# Patient Record
Sex: Male | Born: 1985 | Race: White | Hispanic: No | Smoking: Former smoker
Health system: Southern US, Community
[De-identification: ages and names within clinical notes are randomized; demographics above are authoritative.]

## PROBLEM LIST (undated history)

## (undated) DIAGNOSIS — F419 Anxiety disorder, unspecified: Secondary | ICD-10-CM

## (undated) DIAGNOSIS — F909 Attention-deficit hyperactivity disorder, unspecified type: Secondary | ICD-10-CM

## (undated) HISTORY — PX: CLOSED REDUCTION HAND FRACTURE: SHX973

## (undated) HISTORY — PX: MYRINGOTOMY WITH TUBE PLACEMENT: SHX5663

## (undated) HISTORY — DX: Anxiety disorder, unspecified: F41.9

## (undated) HISTORY — DX: Attention-deficit hyperactivity disorder, unspecified type: F90.9

---
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
## 2008-09-04 ENCOUNTER — Emergency Department (HOSPITAL_COMMUNITY): Admission: EM | Admit: 2008-09-04 | Discharge: 2008-09-04 | Payer: Self-pay | Admitting: Emergency Medicine
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
## 2017-05-09 ENCOUNTER — Encounter: Payer: Self-pay | Admitting: Physician Assistant

## 2017-05-09 ENCOUNTER — Ambulatory Visit (INDEPENDENT_AMBULATORY_CARE_PROVIDER_SITE_OTHER): Payer: PRIVATE HEALTH INSURANCE | Admitting: Physician Assistant

## 2017-05-09 VITALS — BP 116/72 | HR 68 | Temp 98.5°F | Resp 16 | Ht 68.0 in | Wt 173.0 lb

## 2017-05-09 DIAGNOSIS — Z131 Encounter for screening for diabetes mellitus: Secondary | ICD-10-CM

## 2017-05-09 DIAGNOSIS — F329 Major depressive disorder, single episode, unspecified: Secondary | ICD-10-CM | POA: Diagnosis not present

## 2017-05-09 DIAGNOSIS — Z13220 Encounter for screening for lipoid disorders: Secondary | ICD-10-CM | POA: Diagnosis not present

## 2017-05-09 DIAGNOSIS — Z0000 Encounter for general adult medical examination without abnormal findings: Secondary | ICD-10-CM | POA: Diagnosis not present

## 2017-05-09 DIAGNOSIS — Z1329 Encounter for screening for other suspected endocrine disorder: Secondary | ICD-10-CM | POA: Diagnosis not present

## 2017-05-09 DIAGNOSIS — Z8659 Personal history of other mental and behavioral disorders: Secondary | ICD-10-CM

## 2017-05-09 DIAGNOSIS — Z23 Encounter for immunization: Secondary | ICD-10-CM | POA: Diagnosis not present
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
## 2017-05-09 NOTE — Patient Instructions (Signed)

## 2017-05-12 ENCOUNTER — Encounter: Payer: Self-pay | Admitting: Physician Assistant

## 2017-05-12 DIAGNOSIS — Z8659 Personal history of other mental and behavioral disorders: Secondary | ICD-10-CM | POA: Insufficient documentation

## 2017-05-12 DIAGNOSIS — F329 Major depressive disorder, single episode, unspecified: Secondary | ICD-10-CM | POA: Insufficient documentation

## 2017-05-13 ENCOUNTER — Telehealth: Payer: Self-pay | Admitting: Physician Assistant

## 2017-05-13 NOTE — Telephone Encounter (Signed)
Can we please call patient and let him know that we do not have paper or electronic records of his assessment or treatment for ADHD and to please go ahead with psychology referral for assessment. If appropriate, will continue treatment after that point. Thank you.

## 2017-06-13 ENCOUNTER — Ambulatory Visit: Payer: PRIVATE HEALTH INSURANCE | Admitting: Physician Assistant

## 2020-04-12 ENCOUNTER — Encounter: Payer: Self-pay | Admitting: Family Medicine

## 2020-04-14 ENCOUNTER — Encounter: Payer: Self-pay | Admitting: Family Medicine

## 2022-08-26 ENCOUNTER — Ambulatory Visit (INDEPENDENT_AMBULATORY_CARE_PROVIDER_SITE_OTHER): Payer: Commercial Managed Care - PPO | Admitting: Family Medicine

## 2022-08-26 ENCOUNTER — Encounter: Payer: Self-pay | Admitting: Family Medicine

## 2022-08-26 VITALS — BP 122/89 | HR 105 | Temp 98.8°F | Ht 67.0 in | Wt 185.0 lb

## 2022-08-26 DIAGNOSIS — L409 Psoriasis, unspecified: Secondary | ICD-10-CM | POA: Diagnosis not present

## 2022-08-26 DIAGNOSIS — Z1159 Encounter for screening for other viral diseases: Secondary | ICD-10-CM | POA: Diagnosis not present

## 2022-08-26 DIAGNOSIS — F909 Attention-deficit hyperactivity disorder, unspecified type: Secondary | ICD-10-CM

## 2022-08-26 DIAGNOSIS — Z0001 Encounter for general adult medical examination with abnormal findings: Secondary | ICD-10-CM | POA: Diagnosis not present

## 2022-08-26 DIAGNOSIS — Z13220 Encounter for screening for lipoid disorders: Secondary | ICD-10-CM | POA: Diagnosis not present

## 2022-08-26 DIAGNOSIS — Z0000 Encounter for general adult medical examination without abnormal findings: Secondary | ICD-10-CM

## 2022-08-26 LAB — CBC WITH DIFFERENTIAL/PLATELET
Basophils Absolute: 38 cells/uL (ref 0–200)
Eosinophils Absolute: 221 cells/uL (ref 15–500)
Eosinophils Relative: 3.5 %
Hemoglobin: 16.1 g/dL (ref 13.2–17.1)
Lymphs Abs: 1644 cells/uL (ref 850–3900)
MCH: 31.6 pg (ref 27.0–33.0)
MCHC: 34.7 g/dL (ref 32.0–36.0)
Monocytes Relative: 10.2 %
RBC: 5.09 10*6/uL (ref 4.20–5.80)
Total Lymphocyte: 26.1 %
WBC: 6.3 10*3/uL (ref 3.8–10.8)

## 2022-08-26 MED ORDER — AMPHETAMINE-DEXTROAMPHETAMINE 10 MG PO TABS: 10.0000 mg | ORAL_TABLET | Freq: Two times a day (BID) | ORAL | 0 refills | Status: DC

## 2022-08-26 MED ORDER — HYDROCORTISONE BUTYRATE 0.1 % EX CREA: 1.0000 | TOPICAL_CREAM | Freq: Two times a day (BID) | CUTANEOUS | 0 refills | Status: AC

## 2022-08-26 NOTE — Assessment & Plan Note (Signed)

## 2022-08-26 NOTE — Patient Instructions (Signed)
It was great to meet you today and I'm excited to have you join the [facility] practice. I hope you had a positive experience today! If you feel so inclined, please feel free to recommend our practice to friends and family. Loveah Like, FNP-C  

## 2022-08-26 NOTE — Progress Notes (Signed)
New Patient Office Visit  Subjective    Patient ID: Kevin Andersen, male    DOB: June 09, 1986  Age: 37 y.o. MRN: [id number]  CC:  Chief Complaint  Patient presents with   Establish Care    Discuss other health issues as well    HPI [name] presents to establish care. Oriented to practice routines and expectations. He has a PMH of ADHD and overweight. His previous PCP, [facility], shut down and he has not been able to obtain his refills for Adderrall which is affecting his work. He is also seeing a dietician that is prescribing Phentermine 37.5mg  daily. His primary concerns today include dry skin on his bilateral lower legs that has been there for weeks.  [name] ADHD was well controlled on Adderall 20mg  in AM and 10mg  in PM up until his previous PCP practice closed and he has been unable to obtain his refills. He denies anxiety and depression but did score high on his PHQ and GAD stating that these symptoms are a result of stress from work due to inability to focus. Scored high on the ASRS-v1.1 symptom checklist with every symptom but 3 occurring very often.   Outpatient Encounter Medications as of 08/26/2022  Medication Sig   amphetamine-dextroamphetamine (ADDERALL) 10 MG tablet Take 1 tablet (10 mg total) by mouth 2 (two) times daily.   hydrocortisone butyrate (LUCOID) 0.1 % CREA cream Apply 1 Application topically 2 (two) times daily.   phentermine (ADIPEX-P) 37.5 MG tablet Take 37.5 mg by mouth daily before breakfast.   No facility-administered encounter medications on file as of 08/26/2022.    Past Medical History:  Diagnosis Date   ADHD    Anxiety     Past Surgical History:  Procedure Laterality Date   CLOSED REDUCTION HAND FRACTURE Right    MYRINGOTOMY WITH TUBE PLACEMENT      Family History  Problem Relation Age of Onset   Fibromyalgia Mother    Arthritis Mother    Breast cancer Maternal Grandmother    Lymphoma Maternal Grandmother     Congestive Heart Failure Maternal Grandmother    Mitral valve prolapse Maternal Grandmother    Hypertension Maternal Grandfather    Diabetes Maternal Grandfather    Arthritis Maternal Grandfather    Dementia Paternal Grandmother    Hypertension Paternal Grandmother    Congestive Heart Failure Paternal Grandmother    Lung cancer Paternal Grandfather    Kidney cancer Paternal Grandfather     Social History   Socioeconomic History   Marital status: Single    Spouse name: Not on file   Number of children: Not on file   Years of education: Not on file   Highest education level: Not on file  Occupational History   Not on file  Tobacco Use   Smoking status: Former    Types: Cigarettes    Quit date: 07/29/2014    Years since quitting: 8.0   Smokeless tobacco: Never  Vaping Use   Vaping Use: Never used  Substance and Sexual Activity   Alcohol use: No   Drug use: No   Sexual activity: Yes    Partners: Female  Other Topics Concern   Not on file  Social History Narrative   Not on file   Social Determinants of Health   Financial Resource Strain: Not on file  Food Insecurity: Not on file  Transportation Needs: Not on file  Physical Activity: Not on file  Stress: Not on file  Social Connections: Not on file  Intimate Partner Violence: Not on file    Review of Systems  Constitutional: Negative.   HENT: Negative.    Eyes: Negative.   Respiratory: Negative.    Cardiovascular: Negative.   Gastrointestinal: Negative.   Genitourinary: Negative.   Musculoskeletal: Negative.   Skin:  Positive for rash.  Neurological: Negative.   Endo/Heme/Allergies: Negative.   Psychiatric/Behavioral: Negative.    All other systems reviewed and are negative.     08/26/2022    8:56 AM  GAD 7 : Generalized Anxiety Score  Nervous, Anxious, on Edge 3  Control/stop worrying 3  Worry too much - different things 3  Trouble relaxing 3  Restless 3  Easily annoyed or irritable 1  Afraid -  awful might happen 3  Total GAD 7 Score 19  Anxiety Difficulty Very difficult        08/26/2022    8:56 AM 05/09/2017    2:55 PM  Depression screen PHQ 2/9  Decreased Interest 2 3  Down, Depressed, Hopeless 2 3  PHQ - 2 Score 4 6  Altered sleeping 2 3  Tired, decreased energy 1 3  Change in appetite 3 3  Feeling bad or failure about yourself  1 3  Trouble concentrating 3 3  Moving slowly or fidgety/restless 2 2  Suicidal thoughts 0 0  PHQ-9 Score 16 23  Difficult doing work/chores Very difficult Extremely dIfficult        Objective    BP 122/89   Pulse (!) 105   Temp 98.8 F (37.1 C) (Oral)   Ht 5\' 7"  (1.702 m)   Wt 185 lb (83.9 kg)   SpO2 96%   BMI 28.98 kg/m   Physical Exam Vitals and nursing note reviewed.  Constitutional:      Appearance: Normal appearance. He is normal weight.  HENT:     Head: Normocephalic and atraumatic.     Right Ear: Tympanic membrane, ear canal and external ear normal.     Left Ear: Tympanic membrane, ear canal and external ear normal.     Nose: Nose normal.     Mouth/Throat:     Mouth: Mucous membranes are moist.     Pharynx: Oropharynx is clear.  Eyes:     Extraocular Movements: Extraocular movements intact.     Right eye: Normal extraocular motion and no nystagmus.     Left eye: Normal extraocular motion and no nystagmus.     Conjunctiva/sclera: Conjunctivae normal.     Pupils: Pupils are equal, round, and reactive to light.  Cardiovascular:     Rate and Rhythm: Normal rate and regular rhythm.     Pulses: Normal pulses.     Heart sounds: Normal heart sounds.  Pulmonary:     Effort: Pulmonary effort is normal.     Breath sounds: Normal breath sounds.  Abdominal:     General: Bowel sounds are normal.     Palpations: Abdomen is soft.  Genitourinary:    Comments: Deferred using shared decision making Musculoskeletal:        General: Normal range of motion.     Cervical back: Normal range of motion and neck supple.   Skin:    General: Skin is warm and dry.     Capillary Refill: Capillary refill takes less than 2 seconds.     Findings: Rash present. Rash is macular and scaling.       Neurological:     General: No focal deficit present.  Mental Status: He is alert. Mental status is at baseline.  Psychiatric:        Mood and Affect: Mood normal.        Speech: Speech normal.        Behavior: Behavior normal.        Thought Content: Thought content normal.        Cognition and Memory: Cognition and memory normal.        Judgment: Judgment normal.         Assessment & Plan:   Problem List Items Addressed This Visit       Musculoskeletal and Integument   Psoriasis    Patient has several pink, scaly plaques to his bilateral lower extremities between his knees and ankles. No identifiable triggers. Appearance is consistent with psoriasis. Will treat with Hydrocortisone .1% cream BID and will return to office if symptoms persist or worsen.        Other   Attention deficit hyperactivity disorder (ADHD)    Patient has history of ADHD that seems to be exasperated by lapse in medication. He reports this is contributing to his symptoms of depression and anxiety as he is feeling stress from work. Denies SI. Will restart Adderall at 10mg  BID, he would like to start at the lower dose. Instructed to stop Phentermine and counseled on the risks of taking both, he is OK with stopping it. Baseline EKG (NSR HR 79) obtained and PDMP reviewed. Follow-up in 4 weeks.      Relevant Orders   EKG 12-Lead (Completed)   Physical exam, annual - Primary    Today your medical history was reviewed and routine physical exam with labs was performed. Recommend 150 minutes of moderate intensity exercise weekly and consuming a well-balanced diet. Advised to stop smoking if a smoker, avoid smoking if a non-smoker, limit alcohol consumption to 1 drink per day for women and 2 drinks per day for men, and avoid illicit drug use.  Counseled on safe sex practices and offered STI testing today. Counseled on the importance of sunscreen use. Counseled in mental health awareness and when to seek medical care. Vaccine maintenance discussed. Appropriate health maintenance items reviewed. Return to office in 1 year for annual physical exam.       Relevant Orders   CBC with Differential/Platelet   COMPLETE METABOLIC PANEL WITH GFR   Lipid panel   Hepatitis C antibody   EKG 12-Lead (Completed)   Other Visit Diagnoses     Need for hepatitis C screening test       Relevant Orders   Hepatitis C antibody   Screening for lipid disorders       Relevant Orders   Lipid panel       Return in about 4 weeks (around 09/23/2022) for ADHD.   Rubie Maid, FNP

## 2022-08-26 NOTE — Assessment & Plan Note (Addendum)
Patient has history of ADHD that seems to be exasperated by lapse in medication. He reports this is contributing to his symptoms of depression and anxiety as he is feeling stress from work. Denies SI. Will restart Adderall at 10mg  BID, he would like to start at the lower dose. Instructed to stop Phentermine and counseled on the risks of taking both, he is OK with stopping it. Baseline EKG (NSR HR 79) obtained and PDMP reviewed. Follow-up in 4 weeks.

## 2022-08-26 NOTE — Assessment & Plan Note (Signed)
Patient has several pink, scaly plaques to his bilateral lower extremities between his knees and ankles. No identifiable triggers. Appearance is consistent with psoriasis. Will treat with Hydrocortisone .1% cream BID and will return to office if symptoms persist or worsen.

## 2022-08-27 ENCOUNTER — Other Ambulatory Visit: Payer: Self-pay | Admitting: Family Medicine

## 2022-08-27 LAB — CBC WITH DIFFERENTIAL/PLATELET
Absolute Monocytes: 643 cells/uL (ref 200–950)
Basophils Relative: 0.6 %
HCT: 46.4 % (ref 38.5–50.0)
MCV: 91.2 fL (ref 80.0–100.0)
MPV: 9.9 fL (ref 7.5–12.5)
Neutro Abs: 3755 cells/uL (ref 1500–7800)
Neutrophils Relative %: 59.6 %
Platelets: 346 10*3/uL (ref 140–400)
RDW: 13 % (ref 11.0–15.0)

## 2022-08-27 LAB — COMPLETE METABOLIC PANEL WITH GFR
AG Ratio: 2.4 (calc) (ref 1.0–2.5)
ALT: 33 U/L (ref 9–46)
AST: 24 U/L (ref 10–40)
Albumin: 5.2 g/dL — ABNORMAL HIGH (ref 3.6–5.1)
Alkaline phosphatase (APISO): 65 U/L (ref 36–130)
BUN: 22 mg/dL (ref 7–25)
CO2: 28 mmol/L (ref 20–32)
Calcium: 10.4 mg/dL — ABNORMAL HIGH (ref 8.6–10.3)
Chloride: 103 mmol/L (ref 98–110)
Creat: 1.15 mg/dL (ref 0.60–1.26)
Globulin: 2.2 g/dL (calc) (ref 1.9–3.7)
Glucose, Bld: 99 mg/dL (ref 65–99)
Potassium: 5.3 mmol/L (ref 3.5–5.3)
Sodium: 142 mmol/L (ref 135–146)
Total Bilirubin: 0.6 mg/dL (ref 0.2–1.2)
Total Protein: 7.4 g/dL (ref 6.1–8.1)
eGFR: 84 mL/min/{1.73_m2} (ref >=60)

## 2022-08-27 LAB — LIPID PANEL
Cholesterol: 200 mg/dL — ABNORMAL HIGH (ref <200)
HDL: 61 mg/dL (ref >=40)
LDL Cholesterol (Calc): 116 mg/dL (calc) — ABNORMAL HIGH
Non-HDL Cholesterol (Calc): 139 mg/dL (calc) — ABNORMAL HIGH (ref <130)
Total CHOL/HDL Ratio: 3.3 (calc) (ref <5.0)
Triglycerides: 115 mg/dL (ref <150)

## 2022-08-27 LAB — HEPATITIS C ANTIBODY: Hepatitis C Ab: NONREACTIVE

## 2022-10-03 ENCOUNTER — Ambulatory Visit (INDEPENDENT_AMBULATORY_CARE_PROVIDER_SITE_OTHER): Payer: Commercial Managed Care - PPO | Admitting: Family Medicine

## 2022-10-03 VITALS — BP 100/72 | HR 63 | Temp 97.5°F | Ht 67.0 in | Wt 174.0 lb

## 2022-10-03 DIAGNOSIS — L409 Psoriasis, unspecified: Secondary | ICD-10-CM

## 2022-10-03 DIAGNOSIS — F411 Generalized anxiety disorder: Secondary | ICD-10-CM | POA: Diagnosis not present

## 2022-10-03 DIAGNOSIS — F902 Attention-deficit hyperactivity disorder, combined type: Secondary | ICD-10-CM

## 2022-10-03 NOTE — Progress Notes (Signed)
Acute Office Visit  Subjective:     Patient ID: Kevin Andersen, male    DOB: [birth year], 37 y.o.   MRN: [id number]  Chief Complaint  Patient presents with   Follow-up    med management/med refills - JBG\\\    HPI Patient is in today for ADHD follow-up. He also requests a dermatology referral as his psoriasis is not improved with steroid cream. We discussed his uncontrolled anxiety and he is also exploring trying medication management again. He has tried several drugs in the past without effect but feels like his anxiety is uncontrolled. We discussed trying GeneSight and he is open to this to narrow down best medication for him.  ADHD FOLLOW UP ADHD status: better Satisfied with current therapy: yes Medication compliance:  excellent compliance Controlled substance contract: no Previous psychiatry evaluation: yes Previous medications: yes adderall   Taking meds on weekends/vacations: occasionally Work/school performance:  excellent Difficulty sustaining attention/completing tasks: no Distracted by extraneous stimuli: no Does not listen when spoken to: no  Fidgets with hands or feet: no Unable to stay in seat: no Blurts out/interrupts others: yes ADHD Medication Side Effects: no    Decreased appetite: no    Headache: no    Sleeping disturbance pattern: no    Irritability: no    Rebound effects (worse than baseline) off medication: no    Anxiousness: no    Dizziness: no    Tics: no   Review of Systems  All other systems reviewed and are negative.       Objective:    BP 100/72   Pulse 63   Temp (!) 97.5 F (36.4 C) (Oral)   Ht '5\' 7"'$  (1.702 m)   Wt 174 lb (78.9 kg)   SpO2 97%   BMI 27.25 kg/m    Physical Exam Vitals and nursing note reviewed.  Constitutional:      Appearance: Normal appearance. He is normal weight.  HENT:     Head: Normocephalic and atraumatic.  Cardiovascular:     Rate and Rhythm: Normal rate and regular rhythm.     Pulses: Normal  pulses.     Heart sounds: Normal heart sounds.  Pulmonary:     Effort: Pulmonary effort is normal.     Breath sounds: Normal breath sounds.  Skin:    General: Skin is warm and dry.     Capillary Refill: Capillary refill takes less than 2 seconds.  Neurological:     General: No focal deficit present.     Mental Status: He is alert and oriented to person, place, and time. Mental status is at baseline.  Psychiatric:        Mood and Affect: Mood normal.        Behavior: Behavior normal.        Thought Content: Thought content normal.        Judgment: Judgment normal.     No results found for any visits on 10/03/22.      Assessment & Plan:   Problem List Items Addressed This Visit       Musculoskeletal and Integument   Psoriasis - Primary   Relevant Orders   Ambulatory referral to Dermatology     Other   Attention deficit hyperactivity disorder (ADHD)    Improving. Continue Adderall '10mg'$  BID. He is no longer taking phentermine. No side effects. Follow up in 3 months.      Generalized anxiety disorder    Open to trying a medication. Would like to  start with GeneSight as he has tried many medications in the past. Encouraged mindful meditation and caffeine reduction, good sleep hygiene. He declines therapy or referral at this time due to time constraints. GAD 19. Denies SI.        No orders of the defined types were placed in this encounter.   Return in about 3 months (around 01/03/2023) for ADHD.  Rubie Maid, FNP

## 2022-10-03 NOTE — Patient Instructions (Addendum)
Depression Medication Choice Decision Aid ([email or web address])

## 2022-10-03 NOTE — Assessment & Plan Note (Signed)
Improving. Continue Adderall '10mg'$  BID. He is no longer taking phentermine. No side effects. Follow up in 3 months.

## 2022-10-03 NOTE — Assessment & Plan Note (Addendum)
Open to trying a medication. Would like to start with GeneSight as he has tried many medications in the past. Encouraged mindful meditation and caffeine reduction, good sleep hygiene. He declines therapy or referral at this time due to time constraints. GAD 19. Denies SI.

## 2022-10-07 ENCOUNTER — Other Ambulatory Visit: Payer: Self-pay

## 2022-10-07 NOTE — Telephone Encounter (Signed)
Pt need by tomorrow pt going out of town.

## 2022-10-08 MED ORDER — AMPHETAMINE-DEXTROAMPHETAMINE 10 MG PO TABS: 10.0000 mg | ORAL_TABLET | Freq: Two times a day (BID) | ORAL | 0 refills | Status: DC

## 2022-10-15 ENCOUNTER — Encounter: Payer: Self-pay | Admitting: Family Medicine

## 2022-11-25 ENCOUNTER — Other Ambulatory Visit: Payer: Self-pay | Admitting: Family Medicine

## 2022-11-25 MED ORDER — AMPHETAMINE-DEXTROAMPHETAMINE 10 MG PO TABS: 10.0000 mg | ORAL_TABLET | Freq: Two times a day (BID) | ORAL | 0 refills | Status: DC

## 2022-12-03 ENCOUNTER — Other Ambulatory Visit: Payer: Self-pay | Admitting: Family Medicine

## 2022-12-09 ENCOUNTER — Telehealth: Payer: Self-pay

## 2022-12-09 NOTE — Telephone Encounter (Signed)
Pt called in to check the status of this refill for amphetamine-dextroamphetamine (ADDERALL) 10 MG tablet [[id number]]. Please advise.  Cb#: [phone]  LOV: 10/03/22  PHARMACY: [facility] [address], [place] - [address] [place] [address] Phone: [phone]  Fax: [phone]

## 2023-03-25 ENCOUNTER — Ambulatory Visit (INDEPENDENT_AMBULATORY_CARE_PROVIDER_SITE_OTHER): Payer: Commercial Managed Care - PPO | Admitting: Family Medicine

## 2023-03-25 VITALS — BP 130/74 | HR 68 | Temp 97.8°F | Ht 67.0 in | Wt 179.0 lb

## 2023-03-25 DIAGNOSIS — Z79899 Other long term (current) drug therapy: Secondary | ICD-10-CM

## 2023-03-25 DIAGNOSIS — G4700 Insomnia, unspecified: Secondary | ICD-10-CM | POA: Diagnosis not present

## 2023-03-25 DIAGNOSIS — F902 Attention-deficit hyperactivity disorder, combined type: Secondary | ICD-10-CM

## 2023-03-25 DIAGNOSIS — E663 Overweight: Secondary | ICD-10-CM | POA: Insufficient documentation

## 2023-03-25 MED ORDER — AMPHETAMINE-DEXTROAMPHETAMINE 10 MG PO TABS: ORAL_TABLET | ORAL | 0 refills | Status: DC

## 2023-03-25 MED ORDER — TRAZODONE HCL 50 MG PO TABS: 25.0000 mg | ORAL_TABLET | Freq: Every evening | ORAL | 0 refills | Status: DC | PRN

## 2023-03-25 NOTE — Assessment & Plan Note (Signed)
Sleep onset difficulties. Provided with handout on good sleep hygiene practices. He is exercising and eating healthy, these have helped a little. He is tracking sleep using his watch and gets 6 hours on average. Will try Trazodone 25-50mg  nightly PRN and see if there is improvement. Advised to hold afternoon Adderall when able.

## 2023-03-25 NOTE — Assessment & Plan Note (Addendum)
Chronic, room for improvement. Increase Adderall to 20mg  daily in AM and 10mg  as needed in afternoon. No side effects. PDMP reviewed, UDS and controlled substance contract completed. Follow up in 3 months or sooner if needed.

## 2023-03-25 NOTE — Progress Notes (Signed)
Subjective:  HPI: Kevin Andersen is a 37 y.o. male presenting on 03/25/2023 for Follow-up (Refill my Adderall prescription/)   HPI Patient is in today for ADHD follow-up and medication refills. He reports symptoms are stable but he still feels like there is room to improve during the day and would like to try increase in AM dose. He also endorses difficulty falling asleep and restless nights for many years. This is not worse since restarting Adderall. He has tried Melatonin and Zquil. He reports difficulty falling asleep and mind racing and worrying. He denies snoring, frequent night wakings or startling awake, and spouse denies these as well. He avoids caffeine, does not take adderall after lunchtime, does not exercise before bed, does use blue screens before bed. Has tried Melatonin and zquil in the past.  ADHD FOLLOW UP ADHD status: controlled Satisfied with current therapy:  would like to try Vyvanse if not too expensive, this worked for him in the past Medication compliance:  excellent compliance Controlled substance contract: yes Previous psychiatry evaluation: yes Previous medications: yes adderall   Taking meds on weekends/vacations: occasionally Work/school performance:  excellent Difficulty sustaining attention/completing tasks:  better Distracted by extraneous stimuli: yes Does not listen when spoken to: yes  Fidgets with hands or feet: yes Unable to stay in seat: yes Blurts out/interrupts others: yes ADHD Medication Side Effects: no    Decreased appetite: no    Headache: no    Sleeping disturbance pattern: yes    Irritability: no    Rebound effects (worse than baseline) off medication: no    Anxiousness: yes    Dizziness: no    Tics: no   Review of Systems  All other systems reviewed and are negative.   Relevant past medical history reviewed and updated as indicated.   Past Medical History:  Diagnosis Date   ADHD    Anxiety      Past Surgical History:   Procedure Laterality Date   CLOSED REDUCTION HAND FRACTURE Right    MYRINGOTOMY WITH TUBE PLACEMENT      Allergies and medications reviewed and updated.   Current Outpatient Medications:    traZODone (DESYREL) 50 MG tablet, Take 0.5-1 tablets (25-50 mg total) by mouth at bedtime as needed for sleep., Disp: 30 tablet, Rfl: 0   amphetamine-dextroamphetamine (ADDERALL) 10 MG tablet, Take 2 tablets (20 mg total) by mouth daily with breakfast AND 1 tablet (10 mg total) daily in the afternoon., Disp: 180 tablet, Rfl: 0   hydrocortisone butyrate (LUCOID) 0.1 % CREA cream, Apply 1 Application topically 2 (two) times daily. (Patient not taking: Reported on 03/25/2023), Disp: 45 g, Rfl: 0  No Known Allergies  Objective:   BP 130/74   Pulse 68   Temp 97.8 F (36.6 C) (Oral)   Ht 5\' 7"  (1.702 m)   Wt 179 lb (81.2 kg)   SpO2 97%   BMI 28.04 kg/m      03/25/2023    7:58 AM 10/03/2022    3:02 PM 08/26/2022    8:38 AM  Vitals with BMI  Height 5\' 7"  5\' 7"    Weight 179 lbs 174 lbs   BMI 28.03 27.25   Systolic 130 100 433  Diastolic 74 72 89  Pulse 68 63      Physical Exam Vitals and nursing note reviewed.  Constitutional:      Appearance: Normal appearance. He is normal weight.  HENT:     Head: Normocephalic and atraumatic.  Cardiovascular:  Rate and Rhythm: Normal rate and regular rhythm.     Pulses: Normal pulses.     Heart sounds: Normal heart sounds.  Pulmonary:     Effort: Pulmonary effort is normal.     Breath sounds: Normal breath sounds.  Skin:    General: Skin is warm and dry.     Capillary Refill: Capillary refill takes less than 2 seconds.  Neurological:     General: No focal deficit present.     Mental Status: He is alert and oriented to person, place, and time. Mental status is at baseline.  Psychiatric:        Mood and Affect: Mood normal.        Behavior: Behavior normal.        Thought Content: Thought content normal.        Judgment: Judgment normal.      Assessment & Plan:  Attention deficit hyperactivity disorder (ADHD), combined type Assessment & Plan: Chronic, room for improvement. Increase Adderall to 20mg  daily in AM and 10mg  as needed in afternoon. No side effects. PDMP reviewed, UDS and controlled substance contract completed. Follow up in 3 months or sooner if needed.  Orders: -     DRUG MONITOR, PANEL 1, W/CONF, URINE  Insomnia, unspecified type Assessment & Plan: Sleep onset difficulties. Provided with handout on good sleep hygiene practices. He is exercising and eating healthy, these have helped a little. He is tracking sleep using his watch and gets 6 hours on average. Will try Trazodone 25-50mg  nightly PRN and see if there is improvement. Advised to hold afternoon Adderall when able.   Controlled substance agreement signed -     DRUG MONITOR, PANEL 1, W/CONF, URINE  Other orders -     traZODone HCl; Take 0.5-1 tablets (25-50 mg total) by mouth at bedtime as needed for sleep.  Dispense: 30 tablet; Refill: 0 -     Amphetamine-Dextroamphetamine; Take 2 tablets (20 mg total) by mouth daily with breakfast AND 1 tablet (10 mg total) daily in the afternoon.  Dispense: 180 tablet; Refill: 0     Follow up plan: Return in about 3 months (around 06/25/2023) for ADHD and physical in January.  Park Meo, FNP

## 2023-03-27 LAB — DRUG MONITOR, PANEL 1, W/CONF, URINE
Amphetamine: 1460 ng/mL — ABNORMAL HIGH (ref <250)
Amphetamines: POSITIVE ng/mL — AB (ref <500)
Barbiturates: NEGATIVE ng/mL (ref <300)
Benzodiazepines: NEGATIVE ng/mL (ref <100)
Cocaine Metabolite: NEGATIVE ng/mL (ref <150)
Creatinine: 33 mg/dL (ref >=20.0)
Marijuana Metabolite: NEGATIVE ng/mL (ref <20)
Methadone Metabolite: NEGATIVE ng/mL (ref <100)
Methamphetamine: NEGATIVE ng/mL (ref <250)
Opiates: NEGATIVE ng/mL (ref <100)
Oxidant: NEGATIVE ug/mL (ref <200)
Oxycodone: NEGATIVE ng/mL (ref <100)
Phencyclidine: NEGATIVE ng/mL (ref <25)
pH: 6 (ref 4.5–9.0)

## 2023-03-27 LAB — DM TEMPLATE

## 2023-06-30 ENCOUNTER — Ambulatory Visit (INDEPENDENT_AMBULATORY_CARE_PROVIDER_SITE_OTHER): Payer: Commercial Managed Care - PPO | Admitting: Family Medicine

## 2023-06-30 ENCOUNTER — Encounter: Payer: Self-pay | Admitting: Family Medicine

## 2023-06-30 VITALS — BP 115/78 | HR 83 | Temp 98.0°F | Ht 67.0 in | Wt 179.0 lb

## 2023-06-30 DIAGNOSIS — F902 Attention-deficit hyperactivity disorder, combined type: Secondary | ICD-10-CM

## 2023-06-30 DIAGNOSIS — F411 Generalized anxiety disorder: Secondary | ICD-10-CM | POA: Diagnosis not present

## 2023-06-30 MED ORDER — FLUOXETINE HCL 10 MG PO CAPS: 10.0000 mg | ORAL_CAPSULE | Freq: Every day | ORAL | 0 refills | Status: DC

## 2023-06-30 MED ORDER — AMPHETAMINE-DEXTROAMPHETAMINE 20 MG PO TABS: ORAL_TABLET | ORAL | 0 refills | Status: DC

## 2023-06-30 NOTE — Assessment & Plan Note (Signed)
GAD 21. He is open to trying a medication. Will start Prozac 10mg  daily. He declines psychiatry at this time due to time constraints. Denies SI.

## 2023-06-30 NOTE — Progress Notes (Signed)
Subjective:  HPI: Kevin Andersen is a 37 y.o. male presenting on 06/30/2023 for Follow-up (3 month f/u meds)   HPI Patient is in today for ADHD follow-up. He reports his symptoms are stable on 20mg  Adderall daily, he is not using this most nights and is taking Trazodone PRN for sleep. He continues to endorse uncontrolled anxiety and worry. GeneSight has been performed in past and he is willing to try a medication to help.   ADHD FOLLOW UP ADHD status: stable Satisfied with current therapy: yes Medication compliance:  excellent compliance Controlled substance contract: yes Previous psychiatry evaluation: yes Previous medications: yes adderall and vyvanse (lisdexamfethamine)   Taking meds on weekends/vacations: yes Work/school performance:  good Difficulty sustaining attention/completing tasks: yes Distracted by extraneous stimuli: yes Does not listen when spoken to: no  Fidgets with hands or feet: yes Unable to stay in seat: no Blurts out/interrupts others: no ADHD Medication Side Effects: no    Decreased appetite: no    Headache: no    Sleeping disturbance pattern: yes    Irritability: no    Rebound effects (worse than baseline) off medication: no    Anxiousness: yes    Dizziness: no    Tics: no     06/30/2023    8:13 AM 03/25/2023    8:52 AM 10/03/2022    3:09 PM 08/26/2022    8:56 AM  GAD 7 : Generalized Anxiety Score  Nervous, Anxious, on Edge 3 3 3 3   Control/stop worrying 3 3 3 3   Worry too much - different things 3 3 3 3   Trouble relaxing 3 3 3 3   Restless 3 3 2 3   Easily annoyed or irritable 3 2 2 1   Afraid - awful might happen 3 3 3 3   Total GAD 7 Score 21 20 19 19   Anxiety Difficulty Very difficult  Somewhat difficult Very difficult      Review of Systems  All other systems reviewed and are negative.   Relevant past medical history reviewed and updated as indicated.   Past Medical History:  Diagnosis Date   ADHD    Anxiety      Past Surgical  History:  Procedure Laterality Date   CLOSED REDUCTION HAND FRACTURE Right    MYRINGOTOMY WITH TUBE PLACEMENT      Allergies and medications reviewed and updated.   Current Outpatient Medications:    FLUoxetine (PROZAC) 10 MG capsule, Take 1 capsule (10 mg total) by mouth daily., Disp: 90 capsule, Rfl: 0   traZODone (DESYREL) 50 MG tablet, Take 0.5-1 tablets (25-50 mg total) by mouth at bedtime as needed for sleep., Disp: 30 tablet, Rfl: 0   amphetamine-dextroamphetamine (ADDERALL) 20 MG tablet, Take 1 tablet (20 mg total) by mouth daily with breakfast AND 0.5 tablets (10 mg total) daily in the afternoon., Disp: 45 tablet, Rfl: 0   hydrocortisone butyrate (LUCOID) 0.1 % CREA cream, Apply 1 Application topically 2 (two) times daily. (Patient not taking: Reported on 03/25/2023), Disp: 45 g, Rfl: 0  No Known Allergies  Objective:   BP 115/78   Pulse 83   Temp 98 F (36.7 C) (Oral)   Ht 5\' 7"  (1.702 m)   Wt 179 lb (81.2 kg)   SpO2 98%   BMI 28.04 kg/m      06/30/2023    8:02 AM 03/25/2023    7:58 AM 10/03/2022    3:02 PM  Vitals with BMI  Height 5\' 7"  5\' 7"  5\' 7"   Weight  179 lbs 179 lbs 174 lbs  BMI 28.03 28.03 27.25  Systolic 115 130 478  Diastolic 78 74 72  Pulse 83 68 63     Physical Exam Vitals and nursing note reviewed.  Constitutional:      Appearance: Normal appearance. He is normal weight.  HENT:     Head: Normocephalic and atraumatic.  Skin:    General: Skin is warm and dry.     Capillary Refill: Capillary refill takes less than 2 seconds.  Neurological:     General: No focal deficit present.     Mental Status: He is alert and oriented to person, place, and time. Mental status is at baseline.  Psychiatric:        Mood and Affect: Mood normal.        Behavior: Behavior normal.        Thought Content: Thought content normal.        Judgment: Judgment normal.     Assessment & Plan:  Attention deficit hyperactivity disorder (ADHD), combined type Assessment  & Plan: Chronic stable. Continue Adderall to 20mg  daily in AM and 10mg  as needed in afternoon. Remains anxious, declines psychiatry referral at this time due to cost. PDMP reviewed, UDS and controlled substance contract complete. Follow up in 3 months or sooner if needed.   Generalized anxiety disorder Assessment & Plan: GAD 21. He is open to trying a medication. Will start Prozac 10mg  daily. He declines psychiatry at this time due to time constraints. Denies SI.    Other orders -     FLUoxetine HCl; Take 1 capsule (10 mg total) by mouth daily.  Dispense: 90 capsule; Refill: 0 -     Amphetamine-Dextroamphetamine; Take 1 tablet (20 mg total) by mouth daily with breakfast AND 0.5 tablets (10 mg total) daily in the afternoon.  Dispense: 45 tablet; Refill: 0     Follow up plan: Return in about 6 weeks (around 08/11/2023).  Park Meo, FNP

## 2023-06-30 NOTE — Assessment & Plan Note (Signed)
Chronic stable. Continue Adderall to 20mg  daily in AM and 10mg  as needed in afternoon. Remains anxious, declines psychiatry referral at this time due to cost. PDMP reviewed, UDS and controlled substance contract complete. Follow up in 3 months or sooner if needed.

## 2023-07-15 ENCOUNTER — Other Ambulatory Visit: Payer: Self-pay

## 2023-07-15 ENCOUNTER — Emergency Department
Admission: EM | Admit: 2023-07-15 | Discharge: 2023-07-15 | Disposition: A | Discharge status: Home / Self Care | Payer: Commercial Managed Care - PPO | Attending: Emergency Medicine | Admitting: Emergency Medicine

## 2023-07-15 ENCOUNTER — Emergency Department: Payer: Commercial Managed Care - PPO

## 2023-07-15 DIAGNOSIS — R0789 Other chest pain: Secondary | ICD-10-CM | POA: Diagnosis present

## 2023-07-15 DIAGNOSIS — R079 Chest pain, unspecified: Secondary | ICD-10-CM

## 2023-07-15 DIAGNOSIS — R0602 Shortness of breath: Secondary | ICD-10-CM | POA: Diagnosis not present

## 2023-07-15 LAB — BASIC METABOLIC PANEL
Anion gap: 9 (ref 5–15)
BUN: 19 mg/dL (ref 6–20)
CO2: 25 mmol/L (ref 22–32)
Calcium: 9.2 mg/dL (ref 8.9–10.3)
Chloride: 103 mmol/L (ref 98–111)
Creatinine, Ser: 1 mg/dL (ref 0.61–1.24)
GFR, Estimated: >60 mL/min (ref >60)
Glucose, Bld: 110 mg/dL — ABNORMAL HIGH (ref 70–99)
Potassium: 4.2 mmol/L (ref 3.5–5.1)
Sodium: 137 mmol/L (ref 135–145)

## 2023-07-15 LAB — CBC
HCT: 44.5 % (ref 39.0–52.0)
Hemoglobin: 15.3 g/dL (ref 13.0–17.0)
MCH: 31.9 pg (ref 26.0–34.0)
MCHC: 34.4 g/dL (ref 30.0–36.0)
MCV: 92.7 fL (ref 80.0–100.0)
Platelets: 325 10*3/uL (ref 150–400)
RBC: 4.8 MIL/uL (ref 4.22–5.81)
RDW: 12.4 % (ref 11.5–15.5)
WBC: 6.5 10*3/uL (ref 4.0–10.5)
nRBC: 0 % (ref 0.0–0.2)

## 2023-07-15 LAB — TROPONIN I (HIGH SENSITIVITY): Troponin I (High Sensitivity): <2 ng/L (ref <18)

## 2023-07-15 NOTE — ED Triage Notes (Signed)
Pt comes with cp that started yesterday at lunch. Pt states he has lots of pressure there. Pt states he had weird moment and he got dizzy and slurred some speech. Pt states he drank water and it went away.  Pt states achy pain and sob. Pt denies any N/V/D and no hx of this.   Pt speaking in clear complete sentences.

## 2023-07-15 NOTE — ED Provider Notes (Signed)
[facility] Provider Note    Event Date/Time   First MD Initiated Contact with Patient 07/15/23 1119     (approximate)   History   Chest Pain   HPI  Kevin Andersen is a 37 year old male presenting to the emergency department for evaluation of chest pain.  Yesterday around lunch, patient had onset of chest pain described as a pressure in the center of his chest.  He began to feel nervous that something serious might be going on so he drove home but had an episode of lightheadedness with a less than 10-second episode of difficulty with his speech that spontaneously resolved.  No syncope.  No focal numbness, tingling, weakness.  No recurrent episodes.  Does report some ongoing discomfort in his chest with mild shortness of breath.  No nausea or vomiting, abdominal pain.    Physical Exam   Triage Vital Signs: ED Triage Vitals  Encounter Vitals Group     BP 07/15/23 0850 (!) 145/90     Systolic BP Percentile --      Diastolic BP Percentile --      Pulse Rate 07/15/23 0850 79     Resp 07/15/23 0850 18     Temp 07/15/23 0850 98 F (36.7 C)     Temp src --      SpO2 07/15/23 0850 99 %     Weight 07/15/23 0849 175 lb (79.4 kg)     Height 07/15/23 0849 5\' 7"  (1.702 m)     Head Circumference --      Peak Flow --      Pain Score 07/15/23 0849 3     Pain Loc --      Pain Education --      Exclude from Growth Chart --     Most recent vital signs: Vitals:   07/15/23 0850  BP: (!) 145/90  Pulse: 79  Resp: 18  Temp: 98 F (36.7 C)  SpO2: 99%     General: Awake, interactive  CV:  Regular rate, good peripheral perfusion.  Chest wall: Nontender to palpation  Resp:  Unlabored respirations, lungs clear to auscultation Abd:  Nondistended, nontender Neuro:  Keenly aware, correctly answers month and age, able to blink eyes and squeeze hands, normal extraocular movements, no visual field loss, normal facial symmetry, no arm or leg motor drift, 5-5 strength  in the bilateral upper and lower extremities, no limb ataxia, normal sensation, no aphasia, no dysarthria, no inattention   ED Results / Procedures / Treatments   Labs (all labs ordered are listed, but only abnormal results are displayed) Labs Reviewed  BASIC METABOLIC PANEL - Abnormal; Notable for the following components:      Result Value   Glucose, Bld 110 (*)    All other components within normal limits  CBC  TROPONIN I (HIGH SENSITIVITY)     EKG EKG independently reviewed interpreted by myself (ER attending) demonstrates:  EKG demonstrates normal sinus rhythm at a rate of 75, PR 138, QRS 80, QTc 404, no acute ST changes   RADIOLOGY Imaging independently reviewed and interpreted by myself demonstrates:  CXR without focal consolidation  PROCEDURES:  Critical Care performed: No  Procedures   MEDICATIONS ORDERED IN ED: Medications - No data to display   IMPRESSION / MDM / ASSESSMENT AND PLAN / ED COURSE  I reviewed the triage vital signs and the nursing notes.  Differential diagnosis includes, but is not limited to, ACS, pneumonia, pneumothorax, low risk PE  and PERC negative, stress mediated physiologic response, musculoskeletal pain  Patient's presentation is most consistent with acute presentation with potential threat to life or bodily function.  37 year old male presenting to the emergency department for evaluation of chest pain.  Vital stable on presentation.  Labs reassuring including negative troponin with greater than 3 hours of pain.  Chest x-Natlie Asfour and EKG reassuring.  Low risk heart score.  Regarding brief episode of lightheadedness with difficult speech, suspect likely presyncopal episode.  Consideration for stress mediated physiologic response as well.  Clinical history is not suggestive of TIA, no deficits here suggestive of acute stroke.  I did discuss results of workup with patient.  He has a primary care doctor and is comfortable with discharge home.   Strict return precautions provided.  Patient discharged stable condition.     FINAL CLINICAL IMPRESSION(S) / ED DIAGNOSES   Final diagnoses:  Acute chest pain     Rx / DC Orders   ED Discharge Orders     None        Note:  This document was prepared using Dragon voice recognition software and may include unintentional dictation errors.   Trinna Post, MD 07/15/23 1146

## 2023-07-15 NOTE — Discharge Instructions (Signed)
You were seen in the Emergency Department today for evaluation of your chest pain. Fortunately, your labs, EKG, and chest x-Emmagrace Runkel were overall reassuring against a emergency cause for your pain. Please follow-up with your primary doctor within the next few days for reevaluation.  You can take 600 mg of ibuprofen every 6 hours for the next few days to see if this improves your symptoms.  You can also take Tylenol as needed.  Return to the ER for any new or worsening symptoms including worsening chest pain, difficulty breathing, or any other new or concerning symptoms that you believe warrants immediate attention.

## 2023-07-15 NOTE — ED Notes (Signed)
See triage note  Presents with some chest discomfort since yesterday  No fever or cough

## 2023-08-26 ENCOUNTER — Other Ambulatory Visit: Payer: Self-pay

## 2023-08-26 ENCOUNTER — Other Ambulatory Visit: Payer: Commercial Managed Care - PPO

## 2023-08-26 MED ORDER — AMPHETAMINE-DEXTROAMPHETAMINE 20 MG PO TABS: ORAL_TABLET | ORAL | 0 refills | Status: DC

## 2023-08-26 NOTE — Telephone Encounter (Signed)
Pt came into ask for a courtesy refill of this med amphetamine-dextroamphetamine (ADDERALL) 20 MG tablet [[id number]]. Pt states that he is out of this med. Pt is scheduled for labs and cpe on 09/02/23. Pt would just like to have enough to make it to his appt on 09/02/23. Please advise.  Cb#: [phone]

## 2023-09-02 ENCOUNTER — Ambulatory Visit (INDEPENDENT_AMBULATORY_CARE_PROVIDER_SITE_OTHER): Payer: Commercial Managed Care - PPO | Admitting: Family Medicine

## 2023-09-02 ENCOUNTER — Encounter: Payer: Self-pay | Admitting: Family Medicine

## 2023-09-02 VITALS — BP 120/80 | HR 77 | Temp 98.6°F | Ht 67.0 in | Wt 192.0 lb

## 2023-09-02 DIAGNOSIS — F411 Generalized anxiety disorder: Secondary | ICD-10-CM

## 2023-09-02 DIAGNOSIS — F902 Attention-deficit hyperactivity disorder, combined type: Secondary | ICD-10-CM

## 2023-09-02 DIAGNOSIS — E663 Overweight: Secondary | ICD-10-CM

## 2023-09-02 DIAGNOSIS — Z0001 Encounter for general adult medical examination with abnormal findings: Secondary | ICD-10-CM

## 2023-09-02 DIAGNOSIS — Z0000 Encounter for general adult medical examination without abnormal findings: Secondary | ICD-10-CM

## 2023-09-02 MED ORDER — AMPHETAMINE-DEXTROAMPHETAMINE 20 MG PO TABS: 20.0000 mg | ORAL_TABLET | Freq: Every day | ORAL | 0 refills | Status: DC

## 2023-09-02 NOTE — Assessment & Plan Note (Signed)
 Chronic stable. Continue Adderall to 20mg  daily in AM, he is not needing PM dosing. Remains anxious, declines psychiatry referral at this time due to cost. PDMP reviewed, UDS and controlled substance contract complete. Follow up in 3 months or sooner if needed.

## 2023-09-02 NOTE — Progress Notes (Signed)
 Complete physical exam  Patient: Kevin Andersen   DOB: [birth year]   38 y.o. Male  MRN: [id number]  Subjective:    Chief Complaint  Patient presents with   Follow-up    annual physical     [place] WENZ[place] is a 38 y.o. male who presents today for a complete physical exam. He reports consuming a general diet. The patient does not participate in regular exercise at present. He generally feels well. He reports sleeping poorly. He does not have additional problems to discuss today.   ADHD FOLLOW UP ADHD status: stable Satisfied with current therapy: yes Medication compliance:  excellent compliance Controlled substance contract: yes Previous psychiatry evaluation: yes Previous medications: yes adderall XR and vyvanse (lisdexamfethamine)   Taking meds on weekends/vacations: occasionally Work/school performance:  good Difficulty sustaining attention/completing tasks: yes Distracted by extraneous stimuli: yes Does not listen when spoken to: yes  Fidgets with hands or feet: yes Unable to stay in seat: yes Blurts out/interrupts others: yes ADHD Medication Side Effects: no    Decreased appetite: no    Headache: no    Sleeping disturbance pattern: yes    Irritability: no    Rebound effects (worse than baseline) off medication: no    Anxiousness: yes    Dizziness: no    Tics: no    Most recent fall risk assessment:    06/30/2023    8:12 AM  Fall Risk   Falls in the past year? 0  Number falls in past yr: 0  Injury with Fall? 0  Risk for fall due to : No Fall Risks     Most recent depression screenings:    09/02/2023    8:27 AM 06/30/2023    8:13 AM  PHQ 2/9 Scores  PHQ - 2 Score 4 5  PHQ- 9 Score 20 20    Vision:Not within last year  and Dental: No current dental problems and Receives regular dental care  Patient Active Problem List   Diagnosis Date Noted   Insomnia 03/25/2023   Overweight (BMI 25.0-29.9) 03/25/2023   Controlled substance agreement signed 03/25/2023    Generalized anxiety disorder 10/03/2022   Attention deficit hyperactivity disorder (ADHD) 08/26/2022   Physical exam, annual 08/26/2022   Psoriasis 08/26/2022   Major depressive disorder with current active episode 05/12/2017   History of ADHD 05/12/2017   Past Medical History:  Diagnosis Date   ADHD    Anxiety    Past Surgical History:  Procedure Laterality Date   CLOSED REDUCTION HAND FRACTURE Right    MYRINGOTOMY WITH TUBE PLACEMENT     Social History   Tobacco Use   Smoking status: Former    Current packs/day: 0.00    Types: Cigarettes    Quit date: 07/29/2014    Years since quitting: 9.1   Smokeless tobacco: Never  Vaping Use   Vaping status: Never Used  Substance Use Topics   Alcohol use: No   Drug use: No   Family History  Problem Relation Age of Onset   Fibromyalgia Mother    Arthritis Mother    Breast cancer Maternal Grandmother    Lymphoma Maternal Grandmother    Congestive Heart Failure Maternal Grandmother    Mitral valve prolapse Maternal Grandmother    Hypertension Maternal Grandfather    Diabetes Maternal Grandfather    Arthritis Maternal Grandfather    Dementia Paternal Grandmother    Hypertension Paternal Grandmother    Congestive Heart Failure Paternal Grandmother    Lung cancer  Paternal Grandfather    Kidney cancer Paternal Grandfather    No Known Allergies    Patient Care Team: Kayla Jeoffrey RAMAN, FNP as PCP - General (Family Medicine)   Outpatient Medications Prior to Visit  Medication Sig   traZODone  (DESYREL ) 50 MG tablet Take 0.5-1 tablets (25-50 mg total) by mouth at bedtime as needed for sleep.   [DISCONTINUED] amphetamine -dextroamphetamine  (ADDERALL) 20 MG tablet Take 1 tablet (20 mg total) by mouth daily with breakfast AND 0.5 tablets (10 mg total) daily in the afternoon.   [DISCONTINUED] FLUoxetine  (PROZAC ) 10 MG capsule Take 1 capsule (10 mg total) by mouth daily.   hydrocortisone  butyrate (LUCOID) 0.1 % CREA cream Apply 1  Application topically 2 (two) times daily. (Patient not taking: Reported on 09/02/2023)   No facility-administered medications prior to visit.    Review of Systems  Constitutional: Negative.   HENT: Negative.    Eyes: Negative.   Respiratory: Negative.    Cardiovascular: Negative.   Gastrointestinal: Negative.   Genitourinary: Negative.   Musculoskeletal: Negative.   Skin: Negative.   Neurological: Negative.   Endo/Heme/Allergies: Negative.   Psychiatric/Behavioral:  Negative for suicidal ideas. The patient is nervous/anxious.   All other systems reviewed and are negative.         Objective:     BP 120/80   Pulse 77   Temp 98.6 F (37 C) (Oral)   Ht 5' 7 (1.702 m)   Wt 192 lb (87.1 kg)   SpO2 97%   BMI 30.07 kg/m  BP Readings from Last 3 Encounters:  09/02/23 120/80  07/15/23 (!) 145/90  06/30/23 115/78   Wt Readings from Last 3 Encounters:  09/02/23 192 lb (87.1 kg)  07/15/23 175 lb (79.4 kg)  06/30/23 179 lb (81.2 kg)      Physical Exam Vitals and nursing note reviewed.  Constitutional:      Appearance: Normal appearance. He is overweight.  HENT:     Head: Normocephalic and atraumatic.     Right Ear: Tympanic membrane, ear canal and external ear normal.     Left Ear: Tympanic membrane, ear canal and external ear normal.     Nose: Nose normal.     Mouth/Throat:     Mouth: Mucous membranes are moist.     Pharynx: Oropharynx is clear.  Eyes:     Extraocular Movements: Extraocular movements intact.     Right eye: Normal extraocular motion and no nystagmus.     Left eye: Normal extraocular motion and no nystagmus.     Conjunctiva/sclera: Conjunctivae normal.     Pupils: Pupils are equal, round, and reactive to light.  Cardiovascular:     Rate and Rhythm: Normal rate and regular rhythm.     Pulses: Normal pulses.     Heart sounds: Normal heart sounds.  Pulmonary:     Effort: Pulmonary effort is normal.     Breath sounds: Normal breath sounds.   Abdominal:     General: Bowel sounds are normal.     Palpations: Abdomen is soft.  Genitourinary:    Comments: Deferred using shared decision making Musculoskeletal:        General: Normal range of motion.     Cervical back: Normal range of motion and neck supple.  Skin:    General: Skin is warm and dry.     Capillary Refill: Capillary refill takes less than 2 seconds.  Neurological:     General: No focal deficit present.     Mental Status:  He is alert. Mental status is at baseline.  Psychiatric:        Mood and Affect: Mood normal.        Speech: Speech normal.        Behavior: Behavior normal.        Thought Content: Thought content normal.        Cognition and Memory: Cognition and memory normal.        Judgment: Judgment normal.      No results found for any visits on 09/02/23. Last CBC Lab Results  Component Value Date   WBC 6.5 07/15/2023   HGB 15.3 07/15/2023   HCT 44.5 07/15/2023   MCV 92.7 07/15/2023   MCH 31.9 07/15/2023   RDW 12.4 07/15/2023   PLT 325 07/15/2023   Last metabolic panel Lab Results  Component Value Date   GLUCOSE 110 (H) 07/15/2023   NA 137 07/15/2023   K 4.2 07/15/2023   CL 103 07/15/2023   CO2 25 07/15/2023   BUN 19 07/15/2023   CREATININE 1.00 07/15/2023   GFRNONAA >60 07/15/2023   CALCIUM 9.2 07/15/2023   PROT 7.4 08/26/2022   BILITOT 0.6 08/26/2022   AST 24 08/26/2022   ALT 33 08/26/2022   ANIONGAP 9 07/15/2023   Last lipids Lab Results  Component Value Date   CHOL 200 (H) 08/26/2022   HDL 61 08/26/2022   LDLCALC 116 (H) 08/26/2022   TRIG 115 08/26/2022   CHOLHDL 3.3 08/26/2022        Assessment & Plan:    Routine Health Maintenance and Physical Exam   There is no immunization history on file for this patient.  Health Maintenance  Topic Date Due   INFLUENZA VACCINE  10/27/2023 (Originally 02/27/2023)   COVID-19 Vaccine (1) 07/21/2024 (Originally 07/09/1991)   Hepatitis C Screening  Completed   HPV VACCINES   Aged Out   DTaP/Tdap/Td  Discontinued   HIV Screening  Discontinued    Discussed health benefits of physical activity, and encouraged him to engage in regular exercise appropriate for his age and condition.  Problem List Items Addressed This Visit     Attention deficit hyperactivity disorder (ADHD)   Chronic stable. Continue Adderall to 20mg  daily in AM, he is not needing PM dosing. Remains anxious, declines psychiatry referral at this time due to cost. PDMP reviewed, UDS and controlled substance contract complete. Follow up in 3 months or sooner if needed.      Physical exam, annual - Primary   Today your medical history was reviewed and routine physical exam with labs was performed. Recommend 150 minutes of moderate intensity exercise weekly and consuming a well-balanced diet. Advised to stop smoking if a smoker, avoid smoking if a non-smoker, limit alcohol consumption to 1 drink per day for women and 2 drinks per day for men, and avoid illicit drug use. Counseled on the importance of sunscreen use. Counseled in mental health awareness and when to seek medical care. Vaccine maintenance discussed. Appropriate health maintenance items reviewed. Return to office in 1 year for annual physical exam.       Generalized anxiety disorder   Better with control of his ADHD, failed trial of Prozac  due to side effects chest pain. Would not like to try another medication at this time. Encouraged psychiatry however he is reluctant. Return to office as need for uncontrolled anxiety.       Overweight (BMI 25.0-29.9)   Recommend low calorie, heart healthy diet and moderate intensity exercise 150 minutes  weekly. This is 3-5 times weekly for 30-50 minutes each session. Goal should be pace of 3 miles/hours, or walking 1.5 miles in 30 minutes and include strength training. Encouraged to resume workout routine.       Return in about 3 months (around 11/30/2023) for chronic follow-up.     Jeoffrey GORMAN Barrio,  FNP

## 2023-09-02 NOTE — Assessment & Plan Note (Signed)
 Today your medical history was reviewed and routine physical exam with labs was performed. Recommend 150 minutes of moderate intensity exercise weekly and consuming a well-balanced diet. Advised to stop smoking if a smoker, avoid smoking if a non-smoker, limit alcohol  consumption to 1 drink per day for women and 2 drinks per day for men, and avoid illicit drug use.Counseled on the importance of sunscreen use. Counseled in mental health awareness and when to seek medical care. Vaccine maintenance discussed. Appropriate health maintenance items reviewed. Return to office in 1 year for annual physical exam.

## 2023-09-02 NOTE — Assessment & Plan Note (Signed)
 Recommend low calorie, heart healthy diet and moderate intensity exercise 150 minutes weekly. This is 3-5 times weekly for 30-50 minutes each session. Goal should be pace of 3 miles/hours, or walking 1.5 miles in 30 minutes and include strength training. Encouraged to resume workout routine.

## 2023-09-02 NOTE — Assessment & Plan Note (Signed)
Better with control of his ADHD, failed trial of Prozac due to side effects chest pain. Would not like to try another medication at this time. Encouraged psychiatry however he is reluctant. Return to office as need for uncontrolled anxiety.

## 2023-11-07 ENCOUNTER — Other Ambulatory Visit: Payer: Self-pay | Admitting: Family Medicine

## 2023-11-10 MED ORDER — AMPHETAMINE-DEXTROAMPHETAMINE 20 MG PO TABS: 20.0000 mg | ORAL_TABLET | Freq: Every day | ORAL | 0 refills | Status: DC

## 2023-12-01 ENCOUNTER — Ambulatory Visit: Payer: Commercial Managed Care - PPO | Admitting: Family Medicine

## 2023-12-08 ENCOUNTER — Encounter (HOSPITAL_COMMUNITY): Payer: Self-pay

## 2023-12-15 ENCOUNTER — Ambulatory Visit: Admitting: Family Medicine

## 2024-01-14 ENCOUNTER — Encounter: Payer: Self-pay | Admitting: Family Medicine

## 2024-01-14 ENCOUNTER — Ambulatory Visit: Admitting: Family Medicine

## 2024-01-14 VITALS — BP 132/83 | HR 84 | Temp 98.4°F | Ht 67.0 in | Wt 183.0 lb

## 2024-01-14 DIAGNOSIS — R5383 Other fatigue: Secondary | ICD-10-CM | POA: Diagnosis not present

## 2024-01-14 DIAGNOSIS — Z1321 Encounter for screening for nutritional disorder: Secondary | ICD-10-CM

## 2024-01-14 DIAGNOSIS — Z131 Encounter for screening for diabetes mellitus: Secondary | ICD-10-CM

## 2024-01-14 DIAGNOSIS — G4700 Insomnia, unspecified: Secondary | ICD-10-CM

## 2024-01-14 DIAGNOSIS — F902 Attention-deficit hyperactivity disorder, combined type: Secondary | ICD-10-CM | POA: Diagnosis not present

## 2024-01-14 DIAGNOSIS — Z1329 Encounter for screening for other suspected endocrine disorder: Secondary | ICD-10-CM

## 2024-01-14 MED ORDER — TRAZODONE HCL 50 MG PO TABS: 25.0000 mg | ORAL_TABLET | Freq: Every evening | ORAL | 0 refills | Status: DC | PRN

## 2024-01-14 MED ORDER — AMPHETAMINE-DEXTROAMPHETAMINE 20 MG PO TABS: 20.0000 mg | ORAL_TABLET | Freq: Every day | ORAL | 0 refills | Status: DC

## 2024-01-14 NOTE — Progress Notes (Unsigned)
   Subjective:  HPI: Kevin Andersen is a 38 y.o. male presenting on 01/14/2024 for Medical Management of Chronic Issues (3 mo f/u /Please check ears )   HPI Patient is in today for follow up for ADHD. Continues to take Adderall    ROS  Relevant past medical history reviewed and updated as indicated.   Past Medical History:  Diagnosis Date  . ADHD   . Anxiety      Past Surgical History:  Procedure Laterality Date  . CLOSED REDUCTION HAND FRACTURE Right   . MYRINGOTOMY WITH TUBE PLACEMENT      Allergies and medications reviewed and updated.   Current Outpatient Medications:  .  amphetamine -dextroamphetamine  (ADDERALL) 20 MG tablet, Take 1 tablet (20 mg total) by mouth daily with breakfast., Disp: 90 tablet, Rfl: 0 .  hydrocortisone  butyrate (LUCOID) 0.1 % CREA cream, Apply 1 Application topically 2 (two) times daily. (Patient not taking: Reported on 01/14/2024), Disp: 45 g, Rfl: 0 .  traZODone  (DESYREL ) 50 MG tablet, Take 0.5-1 tablets (25-50 mg total) by mouth at bedtime as needed for sleep., Disp: 90 tablet, Rfl: 0  No Known Allergies  Objective:   BP 132/83   Pulse 84   Temp 98.4 F (36.9 C)   Ht 5' 7 (1.702 m)   Wt 183 lb (83 kg)   SpO2 95%   BMI 28.66 kg/m      01/14/2024    4:22 PM 09/02/2023    8:16 AM 07/15/2023    8:50 AM  Vitals with BMI  Height 5' 7 5' 7   Weight 183 lbs 192 lbs   BMI 28.66 30.06   Systolic 132 120 413  Diastolic 83 80 90  Pulse 84 77 79     Physical Exam  Assessment & Plan:  Attention deficit hyperactivity disorder (ADHD), combined type  Insomnia, unspecified type -     Testosterone , Free and Total; Future  Screening for diabetes mellitus -     Hemoglobin A1c; Future  Screening for thyroid disorder -     TSH; Future  Encounter for vitamin deficiency screening -     VITAMIN D 25 Hydroxy (Vit-D Deficiency, Fractures); Future  Other fatigue -     Testosterone , Free and Total; Future -     VITAMIN D 25 Hydroxy  (Vit-D Deficiency, Fractures); Future  Other orders -     Amphetamine -Dextroamphetamine ; Take 1 tablet (20 mg total) by mouth daily with breakfast.  Dispense: 90 tablet; Refill: 0 -     traZODone  HCl; Take 0.5-1 tablets (25-50 mg total) by mouth at bedtime as needed for sleep.  Dispense: 90 tablet; Refill: 0     Follow up plan: Return in about 3 months (around 04/15/2024) for ADHD follow-up.  Jenelle Mis, FNP

## 2024-01-15 ENCOUNTER — Encounter: Payer: Self-pay | Admitting: Family Medicine

## 2024-01-15 NOTE — Assessment & Plan Note (Signed)
 Sleep onset difficulties. Discussed good sleep hygiene practices. Will resume Trazodone  25-50mg  nightly PRN and see if there is improvement.

## 2024-01-15 NOTE — Assessment & Plan Note (Signed)
 Chronic stable. Continue Adderall to 20mg  daily in AM, he is not needing PM dosing. Remains anxious, declines psychiatry referral at this time due to cost. PDMP reviewed, UDS and controlled substance contract complete. Follow up in 3 months or sooner if needed.

## 2024-01-27 ENCOUNTER — Other Ambulatory Visit

## 2024-02-10 ENCOUNTER — Other Ambulatory Visit

## 2024-02-17 ENCOUNTER — Other Ambulatory Visit

## 2024-03-01 ENCOUNTER — Other Ambulatory Visit

## 2024-04-15 ENCOUNTER — Ambulatory Visit: Admitting: Family Medicine

## 2024-04-28 ENCOUNTER — Ambulatory Visit: Admitting: Family Medicine

## 2024-05-18 ENCOUNTER — Encounter: Payer: Self-pay | Admitting: Family Medicine

## 2024-05-18 ENCOUNTER — Ambulatory Visit (INDEPENDENT_AMBULATORY_CARE_PROVIDER_SITE_OTHER): Admitting: Family Medicine

## 2024-05-18 VITALS — BP 122/84 | HR 68 | Temp 98.2°F | Ht 67.0 in | Wt 187.6 lb

## 2024-05-18 DIAGNOSIS — F902 Attention-deficit hyperactivity disorder, combined type: Secondary | ICD-10-CM | POA: Diagnosis not present

## 2024-05-18 DIAGNOSIS — F411 Generalized anxiety disorder: Secondary | ICD-10-CM

## 2024-05-18 DIAGNOSIS — Z1329 Encounter for screening for other suspected endocrine disorder: Secondary | ICD-10-CM

## 2024-05-18 DIAGNOSIS — E785 Hyperlipidemia, unspecified: Secondary | ICD-10-CM | POA: Diagnosis not present

## 2024-05-18 DIAGNOSIS — R5382 Chronic fatigue, unspecified: Secondary | ICD-10-CM

## 2024-05-18 DIAGNOSIS — G4700 Insomnia, unspecified: Secondary | ICD-10-CM

## 2024-05-18 DIAGNOSIS — Z1321 Encounter for screening for nutritional disorder: Secondary | ICD-10-CM

## 2024-05-18 MED ORDER — AMPHETAMINE-DEXTROAMPHETAMINE 20 MG PO TABS: 20.0000 mg | ORAL_TABLET | Freq: Every day | ORAL | 0 refills | Status: AC

## 2024-05-18 MED ORDER — TRAZODONE HCL 50 MG PO TABS: 25.0000 mg | ORAL_TABLET | Freq: Every evening | ORAL | 0 refills | Status: AC | PRN

## 2024-05-18 NOTE — Assessment & Plan Note (Signed)
  Insomnia Ongoing sleep difficulties. Trazodone  not picked up. Exercise and diet improvements acknowledged to aid sleep. Trazodone  may assist with depression and anxiety. - Resend prescription for trazodone  25-50 MG oral at bedtime as needed. - Encourage regular exercise and healthy diet.

## 2024-05-18 NOTE — Progress Notes (Signed)
 Subjective:  HPI: Kevin Andersen is a 38 y.o. male presenting on 05/18/2024 for Medical Management of Chronic Issues (ADHD f/u )   HPI Patient is in today for ADHD follow up  Discussed the use of AI scribe software for clinical note transcription with the patient, who gave verbal consent to proceed.  History of Present Illness Kevin Andersen is a 38 year old male who presents for medication management and follow-up for ADHD  He takes amphetamine -dextroamphetamine  20 mg every morning for ADHD, which he finds effective. He previously tried Vyvanse but found it too expensive.  He is not currently taking any medication for depression and has not picked up trazodone , which was prescribed for sleep. His sleep pattern remains unchanged, and he feels accustomed to it. He acknowledges that exercise improves his sleep quality, but he has not been to the gym in about four months.  He notes that he feels better with a healthier diet but admits to not eating well recently and wants to improve his diet as it impacts his overall well-being.  He has a history of high cholesterol and high calcium levels. It has been almost a year since his last blood work. He recalls having borderline low testosterone in the past, which is not documented in his current medical records.  He experiences fatigue and feels tired all the time, attributing it to his work schedule and lifestyle. No chest pain, palpitations, or shortness of breath. Appetite is fine.  Adult ADHD Self Report Scale (most recent)     Adult ADHD Self-Report Scale (ASRS-v1.1) Symptom Checklist - 05/18/24 0912       Part A   1. How often do you have trouble wrapping up the final details of a project, once the challenging parts have been done? Often  2. How often do you have difficulty getting things done in order when you have to do a task that requires organization? Often    3. How often do you have problems remembering appointments or  obligations? Very Often  4. When you have a task that requires a lot of thought, how often do you avoid or delay getting started? Rarely    5. How often do you fidget or squirm with your hands or feet when you have to sit down for a long time? Very Often  6. How often do you feel overly active and compelled to do things, like you were driven by a motor? Sometimes      Part B   7. How often do you make careless mistakes when you have to work on a boring or difficult project? Rarely  8. How often do you have difficulty keeping your attention when you are doing boring or repetitive work? Very Often    9. How often do you have difficulty concentrating on what people say to you, even when they are speaking to you directly? Very Often  10. How often do you misplace or have difficulty finding things at home or at work? Very Often    11. How often are you distracted by activity or noise around you? Very Often  12. How often do you leave your seat in meetings or other situations in which you are expected to remain seated? Very Often    13. How often do you feel restless or fidgety? Very Often  14. How often do you have difficulty unwinding and relaxing when you have time to yourself? Very Often    15. How often do  you find yourself talking too much when you are in social situations? Very Often  16. When you are in a conversation, how often do you find yourself finishing the sentences of the people you are talking to, before they can finish them themselves? Very Often    17. How often do you have difficulty waiting your turn in situations when turn taking is required? Very Often  18. How often do you interrupt others when they are busy? Sometimes             05/18/2024    8:20 AM 01/14/2024    4:33 PM 09/02/2023    8:28 AM 06/30/2023    8:13 AM  GAD 7 : Generalized Anxiety Score  Nervous, Anxious, on Edge 2 3 3 3   Control/stop worrying 3 3 3 3   Worry too much - different things 3 3 3 3   Trouble relaxing 3  3 3 3   Restless 2 3 2 3   Easily annoyed or irritable 2 3 3 3   Afraid - awful might happen 3 3 3 3   Total GAD 7 Score 18 21 20 21   Anxiety Difficulty Very difficult Very difficult Very difficult Very difficult       05/18/2024    8:19 AM 01/14/2024    4:32 PM 09/02/2023    8:27 AM 06/30/2023    8:13 AM 03/25/2023    8:52 AM  Depression screen PHQ 2/9  Decreased Interest 3 3 2 3 2   Down, Depressed, Hopeless 1 2 2 2 1   PHQ - 2 Score 4 5 4 5 3   Altered sleeping 3 3 3 3 3   Tired, decreased energy 3 3 3 3 3   Change in appetite 2 3 3 3  0  Feeling bad or failure about yourself  0 1 1 2 1   Trouble concentrating 2 2 3 2 2   Moving slowly or fidgety/restless 3 3 3 2 3   Suicidal thoughts 0 0 0 0 0  PHQ-9 Score 17 20 20 20 15   Difficult doing work/chores Very difficult Somewhat difficult Very difficult Very difficult Very difficult     Review of Systems  All other systems reviewed and are negative.   Relevant past medical history reviewed and updated as indicated.   Past Medical History:  Diagnosis Date   ADHD    Anxiety      Past Surgical History:  Procedure Laterality Date   CLOSED REDUCTION HAND FRACTURE Right    MYRINGOTOMY WITH TUBE PLACEMENT      Allergies and medications reviewed and updated.   Current Outpatient Medications:    amphetamine -dextroamphetamine  (ADDERALL) 20 MG tablet, Take 1 tablet (20 mg total) by mouth daily with breakfast., Disp: 90 tablet, Rfl: 0   hydrocortisone  butyrate (LUCOID) 0.1 % CREA cream, Apply 1 Application topically 2 (two) times daily. (Patient not taking: Reported on 05/18/2024), Disp: 45 g, Rfl: 0   traZODone  (DESYREL ) 50 MG tablet, Take 0.5-1 tablets (25-50 mg total) by mouth at bedtime as needed for sleep., Disp: 90 tablet, Rfl: 0  No Known Allergies  Objective:   BP 122/84   Pulse 68   Temp 98.2 F (36.8 C)   Ht 5' 7 (1.702 m)   Wt 187 lb 9.6 oz (85.1 kg)   SpO2 98%   BMI 29.38 kg/m      05/18/2024    8:13 AM 01/14/2024     4:22 PM 09/02/2023    8:16 AM  Vitals with BMI  Height 5' 7 5' 7  5' 7  Weight 187 lbs 10 oz 183 lbs 192 lbs  BMI 29.38 28.66 30.06  Systolic 122 132 879  Diastolic 84 83 80  Pulse 68 84 77     Physical Exam Vitals and nursing note reviewed.  Constitutional:      Appearance: Normal appearance. He is normal weight.  HENT:     Head: Normocephalic and atraumatic.  Cardiovascular:     Rate and Rhythm: Normal rate and regular rhythm.     Pulses: Normal pulses.     Heart sounds: Normal heart sounds.  Pulmonary:     Effort: Pulmonary effort is normal.     Breath sounds: Normal breath sounds.  Skin:    General: Skin is warm and dry.     Capillary Refill: Capillary refill takes less than 2 seconds.  Neurological:     General: No focal deficit present.     Mental Status: He is alert and oriented to person, place, and time. Mental status is at baseline.  Psychiatric:        Mood and Affect: Mood normal.        Behavior: Behavior normal.        Thought Content: Thought content normal.        Judgment: Judgment normal.     Assessment & Plan:  Attention deficit hyperactivity disorder (ADHD), combined type Assessment & Plan: Assessment and Plan Assessment & Plan Attention-deficit hyperactivity disorder, combined type Symptoms well-managed with current medication. No dose increase desired. - Continue amphetamine -dextroamphetamine  20 MG oral daily. - Schedule follow-up in six months unless symptoms change.      Chronic fatigue Assessment & Plan: Fatigue Persistent fatigue reported. Possible factors include sleep issues and potential low testosterone. Symptoms of low testosterone discussed. - Evaluate fatigue further with labs, CBC, CMP, TSH, Vitamin D, Testosterone  Orders: -     CBC with Differential/Platelet -     Comprehensive metabolic panel with GFR -     Lipid panel -     TSH -     VITAMIN D 25 Hydroxy (Vit-D Deficiency, Fractures) -     Testosterone , Free  and Total  Generalized anxiety disorder  Hyperlipidemia, unspecified hyperlipidemia type -     Lipid panel  Encounter for vitamin deficiency screening -     VITAMIN D 25 Hydroxy (Vit-D Deficiency, Fractures)  Screening for thyroid disorder -     TSH  Insomnia, unspecified type Assessment & Plan:  Insomnia Ongoing sleep difficulties. Trazodone  not picked up. Exercise and diet improvements acknowledged to aid sleep. Trazodone  may assist with depression and anxiety. - Resend prescription for trazodone  25-50 MG oral at bedtime as needed. - Encourage regular exercise and healthy diet.    Other orders -     traZODone  HCl; Take 0.5-1 tablets (25-50 mg total) by mouth at bedtime as needed for sleep.  Dispense: 90 tablet; Refill: 0 -     Amphetamine -Dextroamphetamine ; Take 1 tablet (20 mg total) by mouth daily with breakfast.  Dispense: 90 tablet; Refill: 0     Follow up plan: Return in about 4 months (around 09/02/2024) for annual physical with labs 1 week prior.  Jeoffrey GORMAN Barrio, FNP

## 2024-05-18 NOTE — Assessment & Plan Note (Signed)
 Fatigue Persistent fatigue reported. Possible factors include sleep issues and potential low testosterone. Symptoms of low testosterone discussed. - Evaluate fatigue further with labs, CBC, CMP, TSH, Vitamin D, Testosterone

## 2024-05-18 NOTE — Assessment & Plan Note (Signed)
 Assessment and Plan Assessment & Plan Attention-deficit hyperactivity disorder, combined type Symptoms well-managed with current medication. No dose increase desired. - Continue amphetamine -dextroamphetamine  20 MG oral daily. - Schedule follow-up in six months unless symptoms change.

## 2024-05-19 ENCOUNTER — Ambulatory Visit: Payer: Self-pay | Admitting: Family Medicine

## 2024-05-23 LAB — CBC WITH DIFFERENTIAL/PLATELET
Absolute Lymphocytes: 2387 {cells}/uL (ref 850–3900)
Absolute Monocytes: 651 {cells}/uL (ref 200–950)
Basophils Absolute: 49 {cells}/uL (ref 0–200)
Basophils Relative: 0.7 %
Eosinophils Absolute: 651 {cells}/uL — ABNORMAL HIGH (ref 15–500)
Eosinophils Relative: 9.3 %
HCT: 48.2 % (ref 38.5–50.0)
Hemoglobin: 16.1 g/dL (ref 13.2–17.1)
MCH: 31.3 pg (ref 27.0–33.0)
MCHC: 33.4 g/dL (ref 32.0–36.0)
MCV: 93.8 fL (ref 80.0–100.0)
MPV: 9.8 fL (ref 7.5–12.5)
Monocytes Relative: 9.3 %
Neutro Abs: 3262 {cells}/uL (ref 1500–7800)
Neutrophils Relative %: 46.6 %
Platelets: 170 Thousand/uL (ref 140–400)
RBC: 5.14 Million/uL (ref 4.20–5.80)
RDW: 12.3 % (ref 11.0–15.0)
Total Lymphocyte: 34.1 %
WBC: 7 Thousand/uL (ref 3.8–10.8)

## 2024-05-23 LAB — VITAMIN D 25 HYDROXY (VIT D DEFICIENCY, FRACTURES): Vit D, 25-Hydroxy: 25 ng/mL — ABNORMAL LOW (ref 30–100)

## 2024-05-23 LAB — COMPREHENSIVE METABOLIC PANEL WITH GFR
AG Ratio: 2.6 (calc) — ABNORMAL HIGH (ref 1.0–2.5)
ALT: 22 U/L (ref 9–46)
AST: 21 U/L (ref 10–40)
Albumin: 4.9 g/dL (ref 3.6–5.1)
Alkaline phosphatase (APISO): 67 U/L (ref 36–130)
BUN: 16 mg/dL (ref 7–25)
CO2: 27 mmol/L (ref 20–32)
Calcium: 9.9 mg/dL (ref 8.6–10.3)
Chloride: 105 mmol/L (ref 98–110)
Creat: 0.93 mg/dL (ref 0.60–1.26)
Globulin: 1.9 g/dL (ref 1.9–3.7)
Glucose, Bld: 113 mg/dL — ABNORMAL HIGH (ref 65–99)
Potassium: 4.8 mmol/L (ref 3.5–5.3)
Sodium: 140 mmol/L (ref 135–146)
Total Bilirubin: 0.5 mg/dL (ref 0.2–1.2)
Total Protein: 6.8 g/dL (ref 6.1–8.1)
eGFR: 107 mL/min/1.73m2 (ref >=60)

## 2024-05-23 LAB — HEMOGLOBIN A1C
Hgb A1c MFr Bld: 5.4 % (ref <5.7)
Mean Plasma Glucose: 108 mg/dL
eAG (mmol/L): 6 mmol/L

## 2024-05-23 LAB — LIPID PANEL
Cholesterol: 199 mg/dL (ref <200)
HDL: 64 mg/dL (ref >=40)
LDL Cholesterol (Calc): 119 mg/dL — ABNORMAL HIGH
Non-HDL Cholesterol (Calc): 135 mg/dL — ABNORMAL HIGH (ref <130)
Total CHOL/HDL Ratio: 3.1 (calc) (ref <5.0)
Triglycerides: 66 mg/dL (ref <150)

## 2024-05-23 LAB — TESTOSTERONE, FREE & TOTAL
Free Testosterone: 120.9 pg/mL (ref 35.0–155.0)
Testosterone, Total, LC-MS-MS: 519 ng/dL (ref 250–1100)

## 2024-05-23 LAB — TEST AUTHORIZATION

## 2024-05-23 LAB — TSH: TSH: 1.74 m[IU]/L (ref 0.40–4.50)

## 2024-09-23 ENCOUNTER — Ambulatory Visit: Admitting: Family Medicine
